# Patient Record
Sex: Male | Born: 1955 | Race: White | Hispanic: No | Marital: Single | Smoking: Never smoker
Health system: Southern US, Community
[De-identification: ages and names within clinical notes are randomized; demographics above are authoritative.]

## PROBLEM LIST (undated history)

## (undated) DIAGNOSIS — Z87898 Personal history of other specified conditions: Secondary | ICD-10-CM

## (undated) DIAGNOSIS — M109 Gout, unspecified: Secondary | ICD-10-CM

## (undated) DIAGNOSIS — E782 Mixed hyperlipidemia: Secondary | ICD-10-CM

## (undated) DIAGNOSIS — R569 Unspecified convulsions: Secondary | ICD-10-CM

---

## 2001-07-10 ENCOUNTER — Emergency Department (HOSPITAL_COMMUNITY): Admission: EM | Admit: 2001-07-10 | Discharge: 2001-07-10 | Payer: Self-pay | Admitting: Emergency Medicine

## 2016-10-06 ENCOUNTER — Encounter (HOSPITAL_COMMUNITY): Payer: Self-pay | Admitting: Emergency Medicine

## 2016-10-06 ENCOUNTER — Emergency Department (HOSPITAL_COMMUNITY)

## 2016-10-06 ENCOUNTER — Emergency Department (HOSPITAL_COMMUNITY)
Admission: EM | Admit: 2016-10-06 | Discharge: 2016-10-07 | Disposition: A | Discharge status: Home / Self Care | Attending: Emergency Medicine | Admitting: Emergency Medicine

## 2016-10-06 DIAGNOSIS — W19XXXA Unspecified fall, initial encounter: Secondary | ICD-10-CM | POA: Diagnosis not present

## 2016-10-06 DIAGNOSIS — M25522 Pain in left elbow: Secondary | ICD-10-CM | POA: Diagnosis not present

## 2016-10-06 DIAGNOSIS — S0990XA Unspecified injury of head, initial encounter: Secondary | ICD-10-CM | POA: Diagnosis present

## 2016-10-06 DIAGNOSIS — Z7982 Long term (current) use of aspirin: Secondary | ICD-10-CM | POA: Insufficient documentation

## 2016-10-06 DIAGNOSIS — Z23 Encounter for immunization: Secondary | ICD-10-CM | POA: Diagnosis not present

## 2016-10-06 DIAGNOSIS — Y990 Civilian activity done for income or pay: Secondary | ICD-10-CM | POA: Insufficient documentation

## 2016-10-06 DIAGNOSIS — S0181XA Laceration without foreign body of other part of head, initial encounter: Secondary | ICD-10-CM | POA: Insufficient documentation

## 2016-10-06 DIAGNOSIS — Z7984 Long term (current) use of oral hypoglycemic drugs: Secondary | ICD-10-CM | POA: Insufficient documentation

## 2016-10-06 DIAGNOSIS — Y9389 Activity, other specified: Secondary | ICD-10-CM | POA: Insufficient documentation

## 2016-10-06 DIAGNOSIS — Y9289 Other specified places as the place of occurrence of the external cause: Secondary | ICD-10-CM | POA: Diagnosis not present

## 2016-10-06 DIAGNOSIS — R569 Unspecified convulsions: Secondary | ICD-10-CM

## 2016-10-06 DIAGNOSIS — Z79899 Other long term (current) drug therapy: Secondary | ICD-10-CM | POA: Insufficient documentation

## 2016-10-06 DIAGNOSIS — M25422 Effusion, left elbow: Secondary | ICD-10-CM | POA: Insufficient documentation

## 2016-10-06 HISTORY — DX: Unspecified convulsions: R56.9

## 2016-10-06 HISTORY — DX: Personal history of other specified conditions: Z87.898

## 2016-10-06 HISTORY — DX: Gout, unspecified: M10.9

## 2016-10-06 HISTORY — DX: Mixed hyperlipidemia: E78.2

## 2016-10-06 LAB — COMPREHENSIVE METABOLIC PANEL
ALBUMIN: 4.3 g/dL (ref 3.5–5.0)
ALK PHOS: 71 U/L (ref 38–126)
ALT: 38 U/L (ref 17–63)
ANION GAP: 9 (ref 5–15)
AST: 45 U/L — ABNORMAL HIGH (ref 15–41)
BUN: 20 mg/dL (ref 6–20)
CALCIUM: 9 mg/dL (ref 8.9–10.3)
CO2: 24 mmol/L (ref 22–32)
Chloride: 109 mmol/L (ref 101–111)
Creatinine, Ser: 0.97 mg/dL (ref 0.61–1.24)
GFR calc Af Amer: >60 mL/min (ref >60)
GFR calc non Af Amer: >60 mL/min (ref >60)
GLUCOSE: 140 mg/dL — AB (ref 65–99)
Potassium: 3.5 mmol/L (ref 3.5–5.1)
Sodium: 142 mmol/L (ref 135–145)
Total Bilirubin: 0.6 mg/dL (ref 0.3–1.2)
Total Protein: 7 g/dL (ref 6.5–8.1)

## 2016-10-06 LAB — CBC WITH DIFFERENTIAL/PLATELET
BASOS PCT: 0 %
Basophils Absolute: 0 10*3/uL (ref 0.0–0.1)
EOS ABS: 0 10*3/uL (ref 0.0–0.7)
Eosinophils Relative: 0 %
HCT: 36.3 % — ABNORMAL LOW (ref 39.0–52.0)
HEMOGLOBIN: 12.1 g/dL — AB (ref 13.0–17.0)
LYMPHS ABS: 1 10*3/uL (ref 0.7–4.0)
LYMPHS PCT: 9 %
MCH: 30.2 pg (ref 26.0–34.0)
MCHC: 33.3 g/dL (ref 30.0–36.0)
MCV: 90.5 fL (ref 78.0–100.0)
MONOS PCT: 7 %
Monocytes Absolute: 0.9 10*3/uL (ref 0.1–1.0)
NEUTROS PCT: 84 %
Neutro Abs: 10.1 10*3/uL — ABNORMAL HIGH (ref 1.7–7.7)
Platelets: 177 10*3/uL (ref 150–400)
RBC: 4.01 MIL/uL — ABNORMAL LOW (ref 4.22–5.81)
RDW: 13.8 % (ref 11.5–15.5)
WBC: 12 10*3/uL — ABNORMAL HIGH (ref 4.0–10.5)

## 2016-10-06 MED ORDER — SODIUM CHLORIDE 0.9 % IV SOLN
1000.0000 mg | Freq: Once | INTRAVENOUS | Status: AC
  Administered 2016-10-06: 1000 mg via INTRAVENOUS
  Filled 2016-10-06: qty 10

## 2016-10-06 MED ORDER — SODIUM CHLORIDE 0.9 % IV BOLUS (SEPSIS)
1000.0000 mL | Freq: Once | INTRAVENOUS | Status: AC
  Administered 2016-10-06: 1000 mL via INTRAVENOUS

## 2016-10-06 MED ORDER — TETANUS-DIPHTH-ACELL PERTUSSIS 5-2.5-18.5 LF-MCG/0.5 IM SUSP
0.5000 mL | Freq: Once | INTRAMUSCULAR | Status: AC
  Administered 2016-10-07: 0.5 mL via INTRAMUSCULAR
  Filled 2016-10-06: qty 0.5

## 2016-10-06 NOTE — ED Triage Notes (Signed)
Pt brought in by EMS from work after having a syncopal episode at work  Pt works at a [employer] and was up on a metal platform when he had a syncopal episode Pt landed face down on the metal grate on which he was standing  Pt has abrasions to his face and bruising to his arm from the grate  Denies neck or back pain  EMS did a 12 lead that showed some peaked T waves so they started an IV and gave a 500 ml bolus of NS

## 2016-10-06 NOTE — ED Notes (Signed)
Bed: WA17 Expected date:  Expected time:  Means of arrival:  Comments: 61 yo M  Syncopal episode

## 2016-10-06 NOTE — ED Notes (Signed)
Patient states he began to get shakey and have vision changed before syncopal episode. Patient has history of seizures, takes Keppra BID and was incontinent on arrival and states that he usually feels that way before his seizures.

## 2016-10-07 MED ORDER — LEVETIRACETAM 1000 MG PO TABS: 1000.0000 mg | ORAL_TABLET | Freq: Two times a day (BID) | ORAL | 0 refills | Status: AC

## 2016-10-07 NOTE — ED Provider Notes (Signed)
[facility]-EMERGENCY DEPT Provider Note   CSN: [id number] Arrival date & time: 10/06/16  2014     History   Chief Complaint Chief Complaint  Patient presents with  . Loss of Consciousness    HPI Scott Harrison is a 61 y.o. male history of, prediabetes here presenting with possible seizure. 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 He did not remember passing out or having a seizure. He did not remember how long he was down for and when he got here, patient noticed that he did urinate on himself. He does have a history of seizures and has been followed with [facility] neurology. Patient is taking Keppra 750 mg twice daily as prescribed.   patient has been seizure-free for the last year and just began to drive. Denies chest pain or shortness of breath prior to it. Also has L elbow pain. Tdap not up to date    The history is provided by the patient.    Past Medical History:  Diagnosis Date  . Elevated triglycerides with high cholesterol   . Gout   . History of prediabetes   . Seizures (HCC)     There are no active problems to display for this patient.   History reviewed. No pertinent surgical history.     Home Medications    Prior to Admission medications   Medication Sig Start Date End Date Taking? Authorizing Provider  allopurinol (ZYLOPRIM) 100 MG tablet Take 100 mg by mouth daily.   Yes Provider, Historical, Scott Harrison  aspirin EC 81 MG tablet Take 81 mg by mouth every morning. 06/04/16 06/04/17 Yes Provider, Historical, Scott Harrison  indomethacin (INDOCIN) 50 MG capsule Take 50 mg by mouth daily as needed for other. 06/04/16  Yes Provider, Historical, Scott Harrison  levETIRAcetam (KEPPRA) 750 MG tablet Take 750 mg by mouth 2 (two) times daily. 11/08/15 11/07/16 Yes Provider, Historical, Scott Harrison  metFORMIN (GLUCOPHAGE-XR) 500 MG 24 hr tablet Take 500 mg by mouth  at bedtime. 06/23/16  Yes Provider, Historical, Scott Harrison  rosuvastatin (CRESTOR) 40 MG tablet Take 40 mg by mouth daily. 06/04/16 06/04/17 Yes Provider, Historical, Scott Harrison    Family History Family History  Problem Relation Age of Onset  . Diabetes Mother     Social History Social History  Substance Use Topics  . Smoking status: Never Smoker  . Smokeless tobacco: Never Used  . Alcohol use No     Allergies   Patient has no known allergies.   Review of Systems Review of Systems  Cardiovascular: Positive for syncope.  Musculoskeletal:       L elbow pain   Skin: Positive for wound.  All other systems reviewed and are negative.    Physical Exam Updated Vital Signs BP (!) 130/91   Pulse 76   Resp 16   SpO2 98%   Physical Exam  Constitutional: He appears well-developed.  HENT:  Head: Normocephalic.  Abrasion forehead. There is 2 cm laceration above L eyebrow that is well approximated. L upper lip swollen with abrasion, no missing teeth   Eyes: Pupils are equal, round, and reactive to light. Conjunctivae and EOM are normal.  Ecchymosis around L eye but extra ocular movement intact   Neck: Normal range of motion. Neck supple.  Cardiovascular: Normal rate, regular rhythm and normal heart sounds.   Pulmonary/Chest: Effort normal and  breath sounds normal. No respiratory distress. He has no wheezes.  Abdominal: Soft. Bowel sounds are normal. He exhibits no distension. There is no tenderness. There is no guarding.  Musculoskeletal:  L elbow slightly swollen and tender, nl ROM   Neurological: He is alert.  Skin: Skin is warm.  Psychiatric: He has a normal mood and affect.  Nursing note and vitals reviewed.    ED Treatments / Results  Labs (all labs ordered are listed, but only abnormal results are displayed) Labs Reviewed  CBC WITH DIFFERENTIAL/PLATELET - Abnormal; Notable for the following:       Result Value   WBC 12.0 (*)    RBC 4.01 (*)    Hemoglobin 12.1 (*)    HCT 36.3  (*)    Neutro Abs 10.1 (*)    All other components within normal limits  COMPREHENSIVE METABOLIC PANEL - Abnormal; Notable for the following:    Glucose, Bld 140 (*)    AST 45 (*)    All other components within normal limits  I-STAT TROPONIN, ED    EKG  EKG Interpretation  Date/Time:  Tuesday October 06 2016 20:37:52 EDT Ventricular Rate:  106 PR Interval:    QRS Duration: 88 QT Interval:  334 QTC Calculation: 444 R Axis:   47 Text Interpretation:  Sinus tachycardia No previous ECGs available Confirmed by Richardean Canal [phone]) on 10/06/2016 10:20:49 PM       Radiology Dg Elbow Complete Left  Result Date: 10/06/2016 CLINICAL DATA:  Syncopal episode with bruising EXAM: LEFT ELBOW - COMPLETE 3+ VIEW COMPARISON:  None. FINDINGS: No definite acute displaced fracture or malalignment is seen. No significant elbow effusion. Minimal spurring at the coronoid process. IMPRESSION: No definite acute osseous abnormality. Radiographic follow-up recommended if persistent clinical concern for fracture Electronically Signed   By: Jasmine Pang M.D.   On: 10/06/2016 22:40   Ct Head Wo Contrast  Result Date: 10/06/2016 CLINICAL DATA:  Syncopal episode while working on airplane at work today. Patient began to be shaking and vision change before the episode. History of seizures. Hematoma and abrasions to the forehead and upper lip. EXAM: CT HEAD WITHOUT CONTRAST CT MAXILLOFACIAL WITHOUT CONTRAST CT CERVICAL SPINE WITHOUT CONTRAST TECHNIQUE: Multidetector CT imaging of the head, cervical spine, and maxillofacial structures were performed using the standard protocol without intravenous contrast. Multiplanar CT image reconstructions of the cervical spine and maxillofacial structures were also generated. COMPARISON:  None. FINDINGS: CT HEAD FINDINGS Brain: No evidence of acute infarction, hemorrhage, hydrocephalus, extra-axial collection or mass lesion/mass effect. Vascular: No hyperdense vessel or unexpected  calcification. Skull: Normal. Negative for fracture or focal lesion. Other: Subcutaneous scalp hematoma over the anterior frontal region. CT MAXILLOFACIAL FINDINGS Osseous: The frontal bones, orbital rims, nasal bones, maxillary antral walls, zygomatic arches, pterygoid plates, mandibles, and temporomandibular joints appear intact. No acute displaced fractures are appreciated. Orbits: Globes and extraocular muscles are intact and symmetrical. Sinuses: Mild mucosal thickening in the paranasal sinuses. No acute air-fluid levels. Mastoid air cells are clear. Soft tissues: Subcutaneous soft tissue hematoma over the anterior frontal region with extension to the left periorbital and left zygomatic region. No retrobulbar extension. CT CERVICAL SPINE FINDINGS Alignment: Normal alignment of the cervical vertebrae and facet joints. C1-2 articulation appears intact. Skull base and vertebrae: No vertebral compression deformities. No focal bone lesion or bone destruction. Bone cortex appears intact. Soft tissues and spinal canal: No prevertebral soft tissue swelling. No paraspinal infiltration. Disc levels: Degenerative changes throughout the cervical spine  with narrowed interspaces and endplate hypertrophic changes. Degenerative changes are most prominent at C3-4, C5-6, and C6-7 levels. Upper chest: No acute abnormalities identified. Other: None. IMPRESSION: 1. No acute intracranial abnormalities. 2. No acute orbital or facial fractures identified. 3. Normal alignment of the cervical spine. Degenerative changes. No acute displaced fractures identified. 4. Subcutaneous soft tissue swelling/hematoma over the anterior frontal region with extension to the left periorbital and left zygomatic arch regions. Electronically Signed   By: Burman Nieves M.D.   On: 10/06/2016 22:33   Ct Cervical Spine Wo Contrast  Result Date: 10/06/2016 CLINICAL DATA:  Syncopal episode while working on airplane at work today. Patient began to be  shaking and vision change before the episode. History of seizures. Hematoma and abrasions to the forehead and upper lip. EXAM: CT HEAD WITHOUT CONTRAST CT MAXILLOFACIAL WITHOUT CONTRAST CT CERVICAL SPINE WITHOUT CONTRAST TECHNIQUE: Multidetector CT imaging of the head, cervical spine, and maxillofacial structures were performed using the standard protocol without intravenous contrast. Multiplanar CT image reconstructions of the cervical spine and maxillofacial structures were also generated. COMPARISON:  None. FINDINGS: CT HEAD FINDINGS Brain: No evidence of acute infarction, hemorrhage, hydrocephalus, extra-axial collection or mass lesion/mass effect. Vascular: No hyperdense vessel or unexpected calcification. Skull: Normal. Negative for fracture or focal lesion. Other: Subcutaneous scalp hematoma over the anterior frontal region. CT MAXILLOFACIAL FINDINGS Osseous: The frontal bones, orbital rims, nasal bones, maxillary antral walls, zygomatic arches, pterygoid plates, mandibles, and temporomandibular joints appear intact. No acute displaced fractures are appreciated. Orbits: Globes and extraocular muscles are intact and symmetrical. Sinuses: Mild mucosal thickening in the paranasal sinuses. No acute air-fluid levels. Mastoid air cells are clear. Soft tissues: Subcutaneous soft tissue hematoma over the anterior frontal region with extension to the left periorbital and left zygomatic region. No retrobulbar extension. CT CERVICAL SPINE FINDINGS Alignment: Normal alignment of the cervical vertebrae and facet joints. C1-2 articulation appears intact. Skull base and vertebrae: No vertebral compression deformities. No focal bone lesion or bone destruction. Bone cortex appears intact. Soft tissues and spinal canal: No prevertebral soft tissue swelling. No paraspinal infiltration. Disc levels: Degenerative changes throughout the cervical spine with narrowed interspaces and endplate hypertrophic changes. Degenerative  changes are most prominent at C3-4, C5-6, and C6-7 levels. Upper chest: No acute abnormalities identified. Other: None. IMPRESSION: 1. No acute intracranial abnormalities. 2. No acute orbital or facial fractures identified. 3. Normal alignment of the cervical spine. Degenerative changes. No acute displaced fractures identified. 4. Subcutaneous soft tissue swelling/hematoma over the anterior frontal region with extension to the left periorbital and left zygomatic arch regions. Electronically Signed   By: Burman Nieves M.D.   On: 10/06/2016 22:33   Ct Maxillofacial Wo Contrast  Result Date: 10/06/2016 CLINICAL DATA:  Syncopal episode while working on airplane at work today. Patient began to be shaking and vision change before the episode. History of seizures. Hematoma and abrasions to the forehead and upper lip. EXAM: CT HEAD WITHOUT CONTRAST CT MAXILLOFACIAL WITHOUT CONTRAST CT CERVICAL SPINE WITHOUT CONTRAST TECHNIQUE: Multidetector CT imaging of the head, cervical spine, and maxillofacial structures were performed using the standard protocol without intravenous contrast. Multiplanar CT image reconstructions of the cervical spine and maxillofacial structures were also generated. COMPARISON:  None. FINDINGS: CT HEAD FINDINGS Brain: No evidence of acute infarction, hemorrhage, hydrocephalus, extra-axial collection or mass lesion/mass effect. Vascular: No hyperdense vessel or unexpected calcification. Skull: Normal. Negative for fracture or focal lesion. Other: Subcutaneous scalp hematoma over the anterior frontal region. CT MAXILLOFACIAL FINDINGS  Osseous: The frontal bones, orbital rims, nasal bones, maxillary antral walls, zygomatic arches, pterygoid plates, mandibles, and temporomandibular joints appear intact. No acute displaced fractures are appreciated. Orbits: Globes and extraocular muscles are intact and symmetrical. Sinuses: Mild mucosal thickening in the paranasal sinuses. No acute air-fluid levels.  Mastoid air cells are clear. Soft tissues: Subcutaneous soft tissue hematoma over the anterior frontal region with extension to the left periorbital and left zygomatic region. No retrobulbar extension. CT CERVICAL SPINE FINDINGS Alignment: Normal alignment of the cervical vertebrae and facet joints. C1-2 articulation appears intact. Skull base and vertebrae: No vertebral compression deformities. No focal bone lesion or bone destruction. Bone cortex appears intact. Soft tissues and spinal canal: No prevertebral soft tissue swelling. No paraspinal infiltration. Disc levels: Degenerative changes throughout the cervical spine with narrowed interspaces and endplate hypertrophic changes. Degenerative changes are most prominent at C3-4, C5-6, and C6-7 levels. Upper chest: No acute abnormalities identified. Other: None. IMPRESSION: 1. No acute intracranial abnormalities. 2. No acute orbital or facial fractures identified. 3. Normal alignment of the cervical spine. Degenerative changes. No acute displaced fractures identified. 4. Subcutaneous soft tissue swelling/hematoma over the anterior frontal region with extension to the left periorbital and left zygomatic arch regions. Electronically Signed   By: Burman Nieves M.D.   On: 10/06/2016 22:33    Procedures Procedures (including critical care time)  LACERATION REPAIR Performed by: Richardean Canal Authorized by: Richardean Canal Consent: Verbal consent obtained. Risks and benefits: risks, benefits and alternatives were discussed Consent given by: patient Patient identity confirmed: provided demographic data Prepped and Draped in normal sterile fashion Wound explored  Laceration Location: L forehead  Laceration Length: 2 cm  No Foreign Bodies seen or palpated  Anesthesia: local infiltration  Local anesthetic: none   Irrigation method: syringe Amount of cleaning: standard  Skin closure: dermabond    Patient tolerance: Patient tolerated the procedure  well with no immediate complications.   Medications Ordered in ED Medications  Tdap (BOOSTRIX) injection 0.5 mL (not administered)  sodium chloride 0.9 % bolus 1,000 mL (1,000 mLs Intravenous New Bag/Given 10/06/16 2303)  levETIRAcetam (KEPPRA) 1,000 mg in sodium chloride 0.9 % 100 mL IVPB (1,000 mg Intravenous New Bag/Given 10/06/16 2303)     Initial Impression / Assessment and Plan / ED Course  I have reviewed the triage vital signs and the nursing notes.  Pertinent labs & imaging results that were available during my care of the patient were reviewed by me and considered in my medical decision making (see chart for details).    OVERTON BOGGUS is a 61 y.o. male here with seizure. Has hx of seizure. Has abrasions on the forehead and scalp hematoma and small laceration. Will get CT head/neck/face, labs. Will consult neuro and load with keppra.   12:12 AM CT head/neck/face showed no fractures. Called Dr. Roseanne Reno from neuro, who recommend IV keppra 1000 mg and increase to 1000 mg BID. Laceration dermabond. Tdap updated. Will dc home. Encouraged him to see neuro and no driving for now    Final Clinical Impressions(s) / ED Diagnoses   Final diagnoses:  None    New Prescriptions New Prescriptions   No medications on file     Scott Pander, Scott Harrison 10/07/16 [phone]

## 2016-10-07 NOTE — Discharge Instructions (Signed)
Increase keppra to 1000 mg twice daily.   Call your neurologist tomorrow for appointment   No driving until cleared by neurologist   Return to ER if you have another seizure, passing out, uncontrolled bleeding

## 2019-04-17 ENCOUNTER — Emergency Department (HOSPITAL_COMMUNITY)
Admission: EM | Admit: 2019-04-17 | Discharge: 2019-04-18 | Disposition: A | Discharge status: Home / Self Care | Attending: Emergency Medicine | Admitting: Emergency Medicine

## 2019-04-17 ENCOUNTER — Other Ambulatory Visit: Payer: Self-pay

## 2019-04-17 DIAGNOSIS — S0181XA Laceration without foreign body of other part of head, initial encounter: Secondary | ICD-10-CM

## 2019-04-17 DIAGNOSIS — Y9289 Other specified places as the place of occurrence of the external cause: Secondary | ICD-10-CM | POA: Insufficient documentation

## 2019-04-17 DIAGNOSIS — W1830XA Fall on same level, unspecified, initial encounter: Secondary | ICD-10-CM | POA: Diagnosis not present

## 2019-04-17 DIAGNOSIS — M62838 Other muscle spasm: Secondary | ICD-10-CM | POA: Insufficient documentation

## 2019-04-17 DIAGNOSIS — Y9301 Activity, walking, marching and hiking: Secondary | ICD-10-CM | POA: Diagnosis not present

## 2019-04-17 DIAGNOSIS — S01111A Laceration without foreign body of right eyelid and periocular area, initial encounter: Secondary | ICD-10-CM | POA: Insufficient documentation

## 2019-04-17 DIAGNOSIS — Y999 Unspecified external cause status: Secondary | ICD-10-CM | POA: Diagnosis not present

## 2019-04-17 DIAGNOSIS — S022XXA Fracture of nasal bones, initial encounter for closed fracture: Secondary | ICD-10-CM | POA: Diagnosis not present

## 2019-04-17 DIAGNOSIS — R569 Unspecified convulsions: Secondary | ICD-10-CM | POA: Diagnosis present

## 2019-04-17 DIAGNOSIS — S01511A Laceration without foreign body of lip, initial encounter: Secondary | ICD-10-CM | POA: Diagnosis not present

## 2019-04-17 NOTE — ED Triage Notes (Signed)
64 yo M BIB GEM[facility] status post unwitnessed seizure. Pt was walking from work area to the break when seizure occurred. Pt has a history of seizures. Pt post ictal on scene GCS of 13 upon EMS arrival. Pt feel face first and has a small skin tear over right eye, laceration over bridge of nose. Pt denies any headache or blurry vision. Pt denies any neck or back pain. No other complaints at the current time per ems.   Vitals: Bp 128/76 Hr 100 cbg 113 98 % room air rr 20

## 2019-04-18 ENCOUNTER — Emergency Department (HOSPITAL_COMMUNITY)

## 2019-04-18 ENCOUNTER — Encounter (HOSPITAL_COMMUNITY): Payer: Self-pay | Admitting: Emergency Medicine

## 2019-04-18 ENCOUNTER — Other Ambulatory Visit: Payer: Self-pay

## 2019-04-18 LAB — CBC WITH DIFFERENTIAL/PLATELET
Abs Immature Granulocytes: 0.05 10*3/uL (ref 0.00–0.07)
Basophils Absolute: 0 10*3/uL (ref 0.0–0.1)
Basophils Relative: 0 %
Eosinophils Absolute: 0.1 10*3/uL (ref 0.0–0.5)
Eosinophils Relative: 1 %
HCT: 40 % (ref 39.0–52.0)
Hemoglobin: 13.1 g/dL (ref 13.0–17.0)
Immature Granulocytes: 1 %
Lymphocytes Relative: 17 %
Lymphs Abs: 1.6 10*3/uL (ref 0.7–4.0)
MCH: 31.5 pg (ref 26.0–34.0)
MCHC: 32.8 g/dL (ref 30.0–36.0)
MCV: 96.2 fL (ref 80.0–100.0)
Monocytes Absolute: 0.7 10*3/uL (ref 0.1–1.0)
Monocytes Relative: 8 %
Neutro Abs: 6.7 10*3/uL (ref 1.7–7.7)
Neutrophils Relative %: 73 %
Platelets: 184 10*3/uL (ref 150–400)
RBC: 4.16 MIL/uL — ABNORMAL LOW (ref 4.22–5.81)
RDW: 12.6 % (ref 11.5–15.5)
WBC: 9.1 10*3/uL (ref 4.0–10.5)
nRBC: 0 % (ref 0.0–0.2)

## 2019-04-18 LAB — BASIC METABOLIC PANEL
Anion gap: 8 (ref 5–15)
BUN: 16 mg/dL (ref 8–23)
CO2: 28 mmol/L (ref 22–32)
Calcium: 9 mg/dL (ref 8.9–10.3)
Chloride: 105 mmol/L (ref 98–111)
Creatinine, Ser: 0.86 mg/dL (ref 0.61–1.24)
GFR calc Af Amer: >60 mL/min (ref >60)
GFR calc non Af Amer: >60 mL/min (ref >60)
Glucose, Bld: 126 mg/dL — ABNORMAL HIGH (ref 70–99)
Potassium: 3.6 mmol/L (ref 3.5–5.1)
Sodium: 141 mmol/L (ref 135–145)

## 2019-04-18 MED ORDER — LIDOCAINE HCL (PF) 1 % IJ SOLN
30.0000 mL | Freq: Once | INTRAMUSCULAR | Status: DC
  Filled 2019-04-18: qty 30

## 2019-04-18 NOTE — ED Notes (Signed)
Pt cleaned up and bandaged post placement of sutures

## 2019-04-18 NOTE — ED Notes (Signed)
Pt discharged with his neighbor

## 2019-04-18 NOTE — ED Provider Notes (Signed)
[facility]-EMERGENCY DEPT Provider Note   CSN: [id number] Arrival date & time: 04/17/19  2346     History Chief Complaint  Patient presents with  . Seizures    Scott Harrison is a 64 y.o. male.  The history is provided by the patient and medical records.  Seizures   64 year old male with history of gout, prediabetes, seizure disorder, presenting to the ED after a seizure.  Patient was at work and was walking to the break room when he had an unwitnessed seizure.  He was found by coworkers flat on his face who then called EMS.  Patient states he woke up in the ambulance.  He did not realize he had had a seizure.  States this is only his fifth lifetime seizure.  He is on Keppra and has been compliant with his medications.  He has follow-up with his neurologist next month.  Patient does have some pain in the face, mostly over the right eye, nose, and mouth.  Tetanus UTD.  Past Medical History:  Diagnosis Date  . Elevated triglycerides with high cholesterol   . Gout   . History of prediabetes   . Seizures (HCC)     There are no problems to display for this patient.   History reviewed. No pertinent surgical history.     Family History  Problem Relation Age of Onset  . Diabetes Mother     Social History   Tobacco Use  . Smoking status: Never Smoker  . Smokeless tobacco: Never Used  Substance Use Topics  . Alcohol use: No  . Drug use: No    Home Medications Prior to Admission medications   Medication Sig Start Date End Date Taking? Authorizing Provider  allopurinol (ZYLOPRIM) 100 MG tablet Take 100 mg by mouth daily.    [provider]  indomethacin (INDOCIN) 50 MG capsule Take 50 mg by mouth daily as needed for other. 06/04/16   [provider]  levETIRAcetam (KEPPRA) 1000 MG tablet Take 1 tablet (1,000 mg total) by mouth 2 (two) times daily. 10/07/16   Charlynne Pander, MD  metFORMIN (GLUCOPHAGE-XR) 500 MG 24 hr tablet Take  500 mg by mouth at bedtime. 06/23/16   [provider]  rosuvastatin (CRESTOR) 40 MG tablet Take 40 mg by mouth daily. 06/04/16 06/04/17  [provider]    Allergies    Patient has no known allergies.  Review of Systems   Review of Systems  Neurological: Positive for seizures.  All other systems reviewed and are negative.   Physical Exam Updated Vital Signs BP 133/85 (BP Location: Right Arm)   Pulse 96   Temp 98.5 F (36.9 C) (Oral)   Resp 15   Ht 5\' 11"  (1.803 m)   Wt 99.3 kg   SpO2 96%   BMI 30.54 kg/m   Physical Exam Vitals and nursing note reviewed.  Constitutional:      Appearance: He is well-developed.  HENT:     Head: Normocephalic and atraumatic.     Comments: Large hematoma right forehead with swelling and bruising extending down to around right eye    Right Ear: Tympanic membrane and ear canal normal.     Left Ear: Tympanic membrane and ear canal normal.     Nose: Signs of injury and nasal tenderness present.     Right Nostril: Epistaxis present.     Left Nostril: Epistaxis present.     Comments: Bridge of nose with large amount of swelling, skin  tear noted, no open wound/laceration present; blood present in both nares    Mouth/Throat:     Mouth: Injury present.      Comments: 1cm laceration along right lower lip, appears to just barely cross into the vermilion border; upper central incisors both appear to have fracture lines horizontally along upper portion of teeth, some mild oozing of blood from the surrounding gingiva present Eyes:     Conjunctiva/sclera: Conjunctivae normal.     Pupils: Pupils are equal, round, and reactive to light.      Comments: 3cm laceration of right eyebrow/upper eyelid, no active bleeding, globe appears intact  Cardiovascular:     Rate and Rhythm: Normal rate and regular rhythm.     Heart sounds: Normal heart sounds.  Pulmonary:     Effort: Pulmonary effort is normal. No respiratory distress.     Breath sounds:  Normal breath sounds. No rhonchi.  Abdominal:     General: Bowel sounds are normal.     Palpations: Abdomen is soft.     Tenderness: There is no abdominal tenderness. There is no rebound.  Musculoskeletal:        General: Normal range of motion.     Cervical back: Normal range of motion.  Skin:    General: Skin is warm and dry.  Neurological:     Mental Status: He is alert and oriented to person, place, and time.     ED Results / Procedures / Treatments   Labs (all labs ordered are listed, but only abnormal results are displayed) Labs Reviewed  CBC WITH DIFFERENTIAL/PLATELET - Abnormal; Notable for the following components:      Result Value   RBC 4.16 (*)    All other components within normal limits  BASIC METABOLIC PANEL - Abnormal; Notable for the following components:   Glucose, Bld 126 (*)    All other components within normal limits    EKG None  Radiology CT Head Wo Contrast  Result Date: 04/18/2019 CLINICAL DATA:  Fall with facial trauma EXAM: CT HEAD WITHOUT CONTRAST CT MAXILLOFACIAL WITHOUT CONTRAST CT CERVICAL SPINE WITHOUT CONTRAST TECHNIQUE: Multidetector CT imaging of the head, cervical spine, and maxillofacial structures were performed using the standard protocol without intravenous contrast. Multiplanar CT image reconstructions of the cervical spine and maxillofacial structures were also generated. COMPARISON:  None. FINDINGS: CT HEAD FINDINGS Brain: There is no mass, hemorrhage or extra-axial collection. The size and configuration of the ventricles and extra-axial CSF spaces are normal. The brain parenchyma is normal, without evidence of acute or chronic infarction. Vascular: No abnormal hyperdensity of the major intracranial arteries or dural venous sinuses. No intracranial atherosclerosis. Skull: Large right frontal scalp hematoma. No skull fracture. CT MAXILLOFACIAL FINDINGS Osseous: --Complex facial fracture types: No LeFort, zygomaticomaxillary complex or  nasoorbitoethmoidal fracture. --Simple fracture types: Medial displacement of right nasal bone fracture --Mandible: No fracture or dislocation. Orbits: The globes are intact. Normal appearance of the intra- and extraconal fat. Symmetric extraocular muscles and optic nerves. Sinuses: No fluid levels or advanced mucosal thickening. Soft tissues: Paranasal and right facial soft tissue swelling. CT CERVICAL SPINE FINDINGS Alignment: No static subluxation. Facets are aligned. Occipital condyles and the lateral masses of C1-C2 are aligned. Skull base and vertebrae: No acute fracture. Soft tissues and spinal canal: No prevertebral fluid or swelling. No visible canal hematoma. Disc levels: Multilevel degenerative disc disease. No bony spinal canal stenosis. Bilateral moderate stenosis of the C7 neural foramina. Upper chest: No pneumothorax, pulmonary nodule or pleural effusion.  Other: Normal visualized paraspinal cervical soft tissues. IMPRESSION: 1. No acute intracranial abnormality. 2. Large right frontal scalp hematoma without skull fracture. 3. Medially displaced right nasal bone fracture. No other facial fracture. 4. No acute fracture or static subluxation of the cervical spine. Electronically Signed   By: Deatra Robinson M.D.   On: 04/18/2019 01:49   CT Cervical Spine Wo Contrast  Result Date: 04/18/2019 CLINICAL DATA:  Fall with facial trauma EXAM: CT HEAD WITHOUT CONTRAST CT MAXILLOFACIAL WITHOUT CONTRAST CT CERVICAL SPINE WITHOUT CONTRAST TECHNIQUE: Multidetector CT imaging of the head, cervical spine, and maxillofacial structures were performed using the standard protocol without intravenous contrast. Multiplanar CT image reconstructions of the cervical spine and maxillofacial structures were also generated. COMPARISON:  None. FINDINGS: CT HEAD FINDINGS Brain: There is no mass, hemorrhage or extra-axial collection. The size and configuration of the ventricles and extra-axial CSF spaces are normal. The brain  parenchyma is normal, without evidence of acute or chronic infarction. Vascular: No abnormal hyperdensity of the major intracranial arteries or dural venous sinuses. No intracranial atherosclerosis. Skull: Large right frontal scalp hematoma. No skull fracture. CT MAXILLOFACIAL FINDINGS Osseous: --Complex facial fracture types: No LeFort, zygomaticomaxillary complex or nasoorbitoethmoidal fracture. --Simple fracture types: Medial displacement of right nasal bone fracture --Mandible: No fracture or dislocation. Orbits: The globes are intact. Normal appearance of the intra- and extraconal fat. Symmetric extraocular muscles and optic nerves. Sinuses: No fluid levels or advanced mucosal thickening. Soft tissues: Paranasal and right facial soft tissue swelling. CT CERVICAL SPINE FINDINGS Alignment: No static subluxation. Facets are aligned. Occipital condyles and the lateral masses of C1-C2 are aligned. Skull base and vertebrae: No acute fracture. Soft tissues and spinal canal: No prevertebral fluid or swelling. No visible canal hematoma. Disc levels: Multilevel degenerative disc disease. No bony spinal canal stenosis. Bilateral moderate stenosis of the C7 neural foramina. Upper chest: No pneumothorax, pulmonary nodule or pleural effusion. Other: Normal visualized paraspinal cervical soft tissues. IMPRESSION: 1. No acute intracranial abnormality. 2. Large right frontal scalp hematoma without skull fracture. 3. Medially displaced right nasal bone fracture. No other facial fracture. 4. No acute fracture or static subluxation of the cervical spine. Electronically Signed   By: Deatra Robinson M.D.   On: 04/18/2019 01:49   CT Maxillofacial Wo Contrast  Result Date: 04/18/2019 CLINICAL DATA:  Fall with facial trauma EXAM: CT HEAD WITHOUT CONTRAST CT MAXILLOFACIAL WITHOUT CONTRAST CT CERVICAL SPINE WITHOUT CONTRAST TECHNIQUE: Multidetector CT imaging of the head, cervical spine, and maxillofacial structures were performed  using the standard protocol without intravenous contrast. Multiplanar CT image reconstructions of the cervical spine and maxillofacial structures were also generated. COMPARISON:  None. FINDINGS: CT HEAD FINDINGS Brain: There is no mass, hemorrhage or extra-axial collection. The size and configuration of the ventricles and extra-axial CSF spaces are normal. The brain parenchyma is normal, without evidence of acute or chronic infarction. Vascular: No abnormal hyperdensity of the major intracranial arteries or dural venous sinuses. No intracranial atherosclerosis. Skull: Large right frontal scalp hematoma. No skull fracture. CT MAXILLOFACIAL FINDINGS Osseous: --Complex facial fracture types: No LeFort, zygomaticomaxillary complex or nasoorbitoethmoidal fracture. --Simple fracture types: Medial displacement of right nasal bone fracture --Mandible: No fracture or dislocation. Orbits: The globes are intact. Normal appearance of the intra- and extraconal fat. Symmetric extraocular muscles and optic nerves. Sinuses: No fluid levels or advanced mucosal thickening. Soft tissues: Paranasal and right facial soft tissue swelling. CT CERVICAL SPINE FINDINGS Alignment: No static subluxation. Facets are aligned. Occipital condyles and the  lateral masses of C1-C2 are aligned. Skull base and vertebrae: No acute fracture. Soft tissues and spinal canal: No prevertebral fluid or swelling. No visible canal hematoma. Disc levels: Multilevel degenerative disc disease. No bony spinal canal stenosis. Bilateral moderate stenosis of the C7 neural foramina. Upper chest: No pneumothorax, pulmonary nodule or pleural effusion. Other: Normal visualized paraspinal cervical soft tissues. IMPRESSION: 1. No acute intracranial abnormality. 2. Large right frontal scalp hematoma without skull fracture. 3. Medially displaced right nasal bone fracture. No other facial fracture. 4. No acute fracture or static subluxation of the cervical spine.  Electronically Signed   By: Deatra RobinsonKevin  Herman M.D.   On: 04/18/2019 01:49    Procedures Procedures (including critical care time)  LACERATION REPAIR Performed by: Garlon HatchetLisa M Tailer Volkert Authorized by: Garlon HatchetLisa M Adalyna Godbee Consent: Verbal consent obtained. Risks and benefits: risks, benefits and alternatives were discussed Consent given by: patient Patient identity confirmed: provided demographic data Prepped and Draped in normal sterile fashion Wound explored  Laceration Location: right eyebrow  Laceration Length: 3 cm  No Foreign Bodies seen or palpated  Anesthesia: local infiltration  Local anesthetic: lidocaine 1% without epinephrine  Anesthetic total: 4 ml  Irrigation method: syringe Amount of cleaning: standard  Skin closure: 4-0 prolene  Number of sutures: 5  Technique: simple interrupted  Patient tolerance: Patient tolerated the procedure well with no immediate complications.  LACERATION REPAIR Performed by: Garlon HatchetLisa M Destyni Hoppel Authorized by: Garlon HatchetLisa M Yassir Enis Consent: Verbal consent obtained. Risks and benefits: risks, benefits and alternatives were discussed Consent given by: patient Patient identity confirmed: provided demographic data Prepped and Draped in normal sterile fashion Wound explored  Laceration Location: right lower lip  Laceration Length: 2cm  No Foreign Bodies seen or palpated  Anesthesia: local infiltration  Local anesthetic: lidocaine 1% without epinephrine  Anesthetic total: 3 ml  Irrigation method: syringe Amount of cleaning: standard  Skin closure: 5-0 vicryl rapide  Number of sutures: 4  Technique: simple interrupted  Patient tolerance: Patient tolerated the procedure well with no immediate complications.   Medications Ordered in ED Medications  lidocaine (PF) (XYLOCAINE) 1 % injection 30 mL (has no administration in time range)    ED Course  I have reviewed the triage vital signs and the nursing notes.  Pertinent labs & imaging  results that were available during my care of the patient were reviewed by me and considered in my medical decision making (see chart for details).    MDM Rules/Calculators/A&P  64 year old male here following seizure.  History of same, none in the past few years.  He is on Keppra and has been compliant.  On arrival to ED he is awake, alert, appropriately oriented.  He is moving his arms and legs well without any noted ataxia.  Speech is clear and goal oriented.  He does have signs of trauma to the head and face with large hematoma to right forehead, laceration of right eyebrow, skin tear and swelling along the bridge of nose, laceration of right lower lip.  His central upper incisors also appear to have fracture lines horizontally along the upper portion of the tooth.  He does report these feel a little loose.  There is no noted trismus or malocclusion.  Will obtain screening labs along with CT of the head/face/neck.  Tetanus is UTD.  Imaging negative side from noted hematoma and right nasal fracture.  c-collar removed, patient was able to range neck without difficulty.  We have discussed imaging results.  Lacerations were repaired as above,  patient tolerated well.  He will need follow-up with ENT regarding nasal fracture and follow-up with dentistry regarding upper central incisors.  Given lip laceration, I have encouraged soft diet for the next few days until these fully heal.  He will need suture removal in 1 week with PCP.  He is placed on seizure driving precautions until follow-up with neurology and cleared from their standpoint.  He will return here for any new/acute changes.  3:06 AM Just prior to discharge patient began complaining of left thigh pain.  On exam he does not have any significant swelling, but does appear to have some spasm of the musculature of the left thigh.  There is no bony deformity and he is moving his leg appropriately.  I have low suspicion for femur fracture.  Patient  states he would feel better with an x-ray so this is been ordered.  3:36 AM Femur films negative.  Discharge with plan as above.  Final Clinical Impression(s) / ED Diagnoses Final diagnoses:  Seizure [facility])  Facial laceration, initial encounter    Rx / DC Orders ED Discharge Orders    None       Garlon Hatchet, PA-C 04/18/19 0404    Devoria Albe, MD 04/18/19 [phone]

## 2019-04-18 NOTE — Discharge Instructions (Addendum)
Recommend tylenol or motrin as needed for pain.  Would continue to ice the area above right eye to help bring swelling down. Follow-up with ENT about your nasal fracture-- call for appt. I would follow-up with dentist about loose teeth.  I have listed out dentist on call if you cannot get in with yours. Follow-up with your primary care doctor in 1 week to have right eyebrow sutures removed.  Sutures in the lip will dissolve on their own. Recommend soft diet for the next few days to prevent further injury to the lip. Return to the ED for new or worsening symptoms.

## 2019-04-18 NOTE — ED Notes (Signed)
Pt complains of left thigh pain, no obvious injury to the leg but the muscle appears to be tight, pt is able to move his leg

## 2019-04-18 NOTE — ED Notes (Signed)
Pt transported to CT ?

## 2021-07-29 IMAGING — CT CT HEAD W/O CM
3 series · 14 of 47 positions shown, 16 images · non-contrast
Comparison: None.

CLINICAL DATA: Fall with facial trauma

EXAM:
CT HEAD WITHOUT CONTRAST
CT MAXILLOFACIAL WITHOUT CONTRAST
CT CERVICAL SPINE WITHOUT CONTRAST
TECHNIQUE: Multidetector CT imaging of the head, cervical spine, and
maxillofacial structures were performed using the standard protocol
without intravenous contrast. Multiplanar CT image reconstructions
of the cervical spine and maxillofacial structures were also
generated.

[Series 3: head wo · axial · 0.47mm/px · z∈[+1518,+1653]mm · 8 of 33 slices shown, 10 images]
[im 3/33  brain]
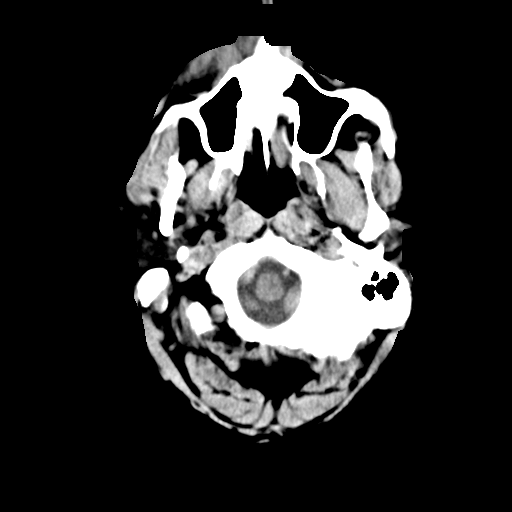
[im 3/33  bone]
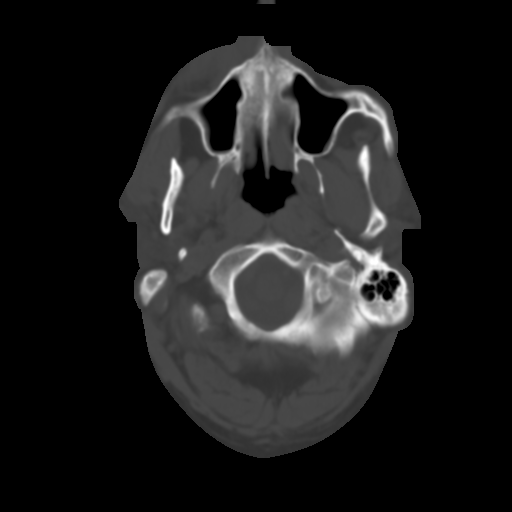
[im 7/33  brain]
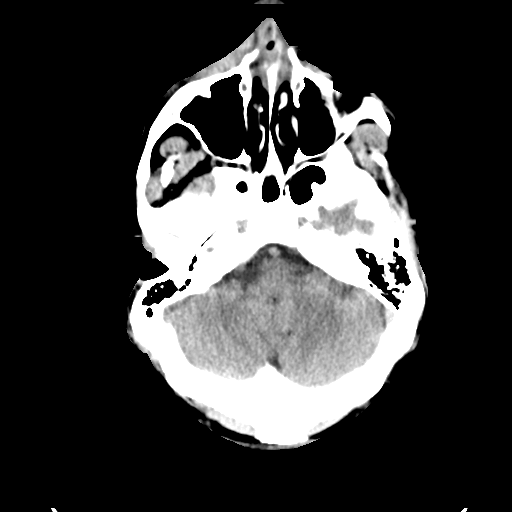
[im 10/33  brain]
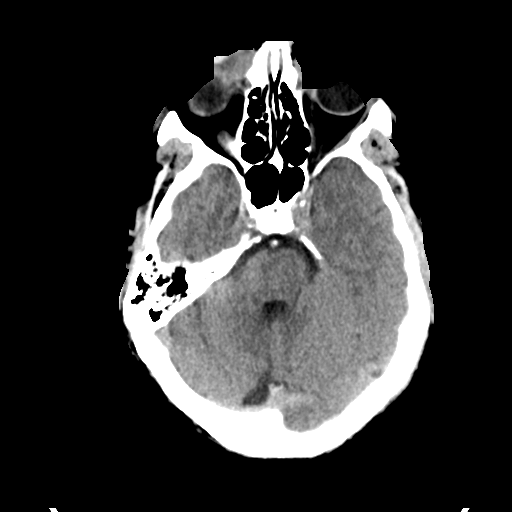
[im 15/33  brain]
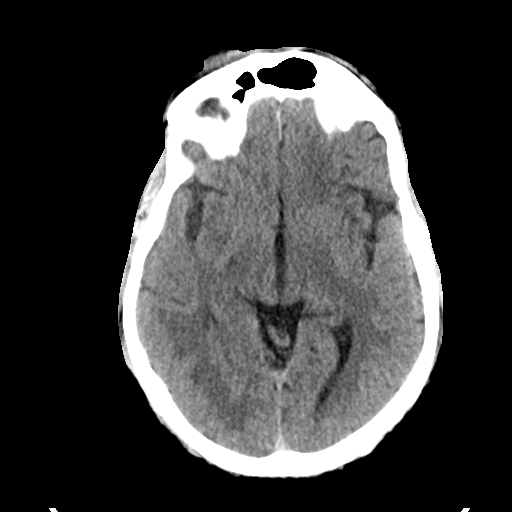
[im 18/33  brain]
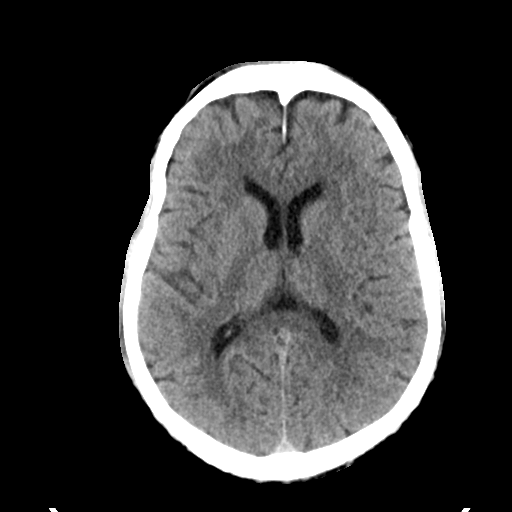
[im 18/33  bone]
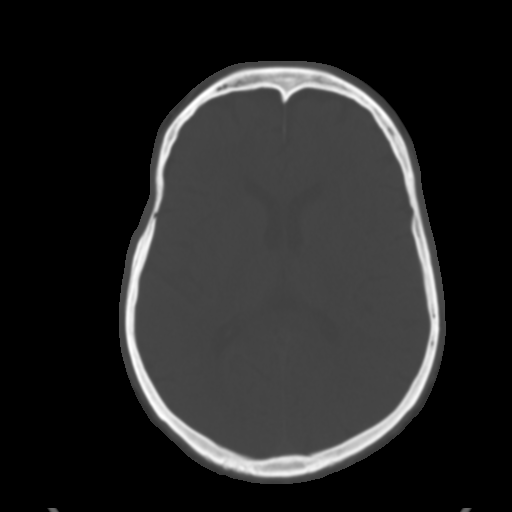
[im 23/33  brain]
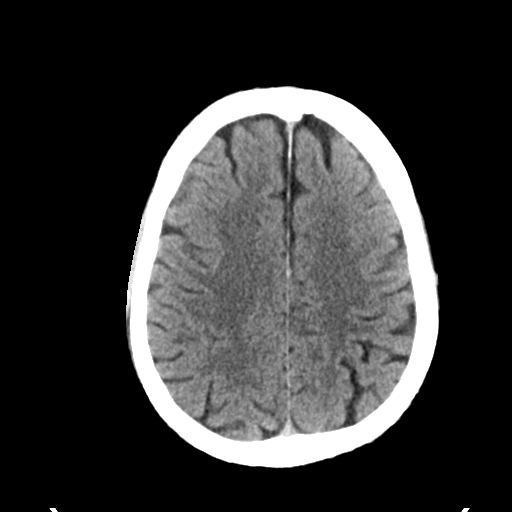
[im 26/33  brain]
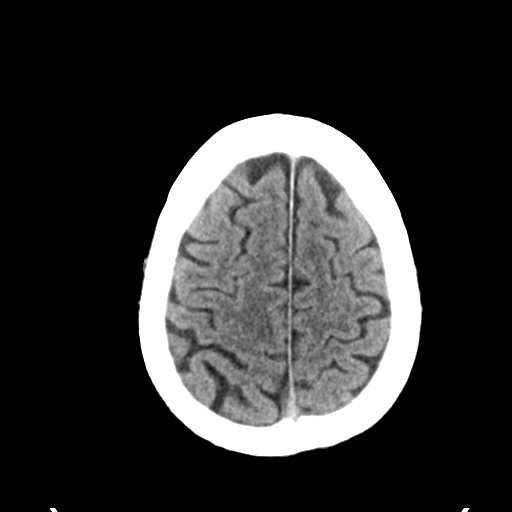
[im 30/33  brain]
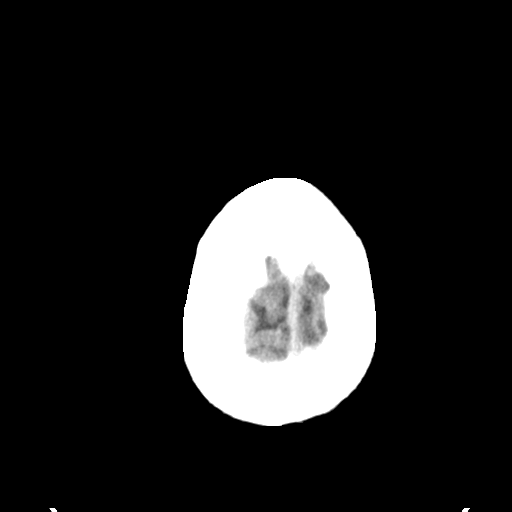

[Series 5: coronal soft tissue · coronal · 0.31mm/px · 3 of 70 slices shown]
[im 24/70  brain]
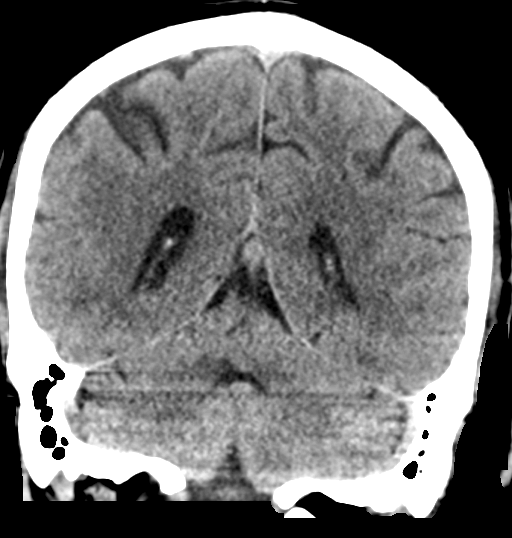
[im 31/70  brain]
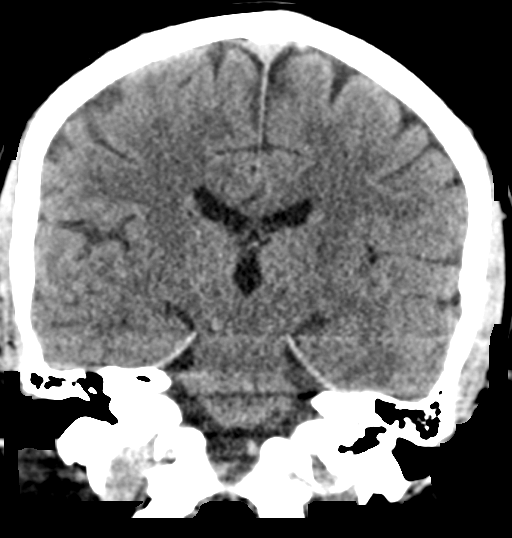
[im 39/70  brain]
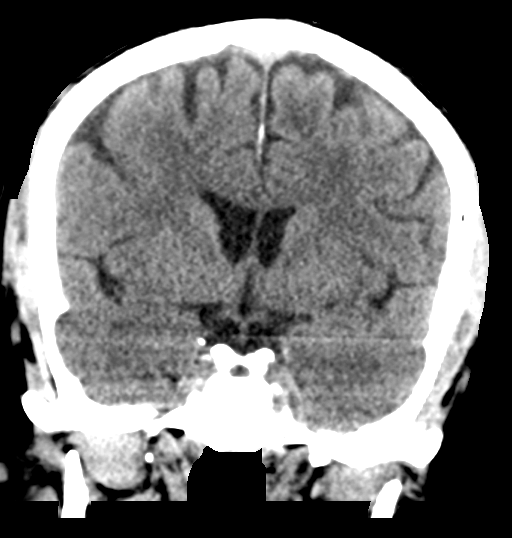

[Series 6: sagittal soft tissue · sagittal · 0.32mm/px · 3 of 55 slices shown]
[im 19/55  brain]
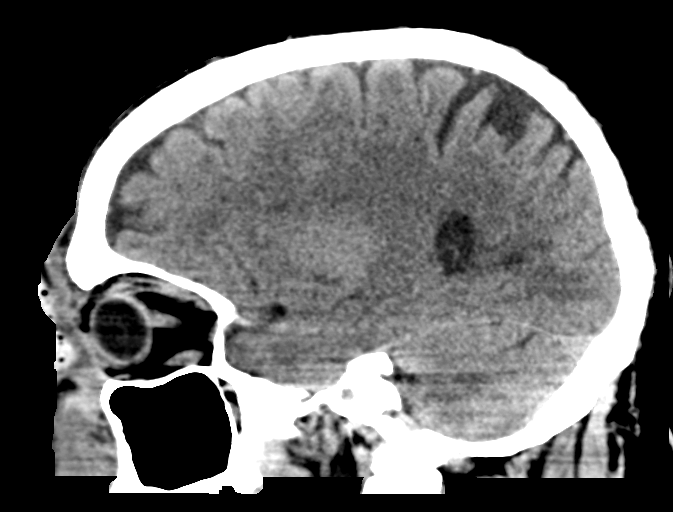
[im 28/55  brain]
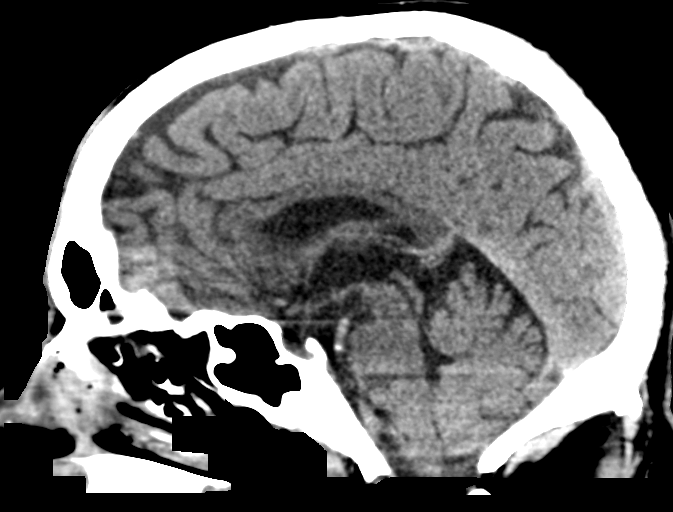
[im 37/55  brain]
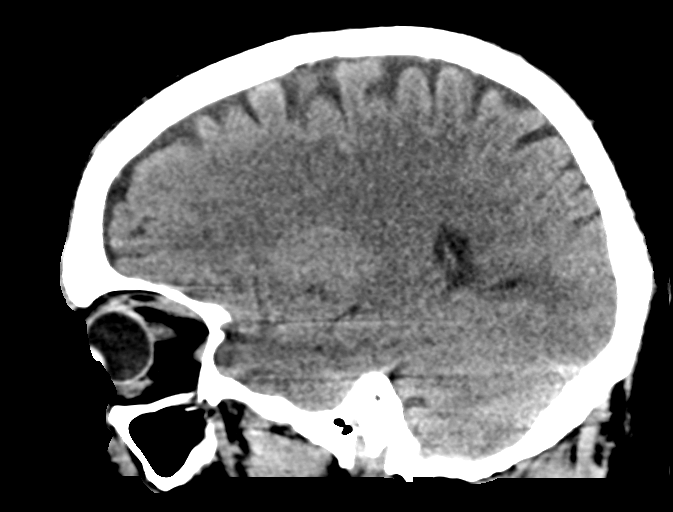

[14 of 47 positions shown; findings below may reference images not displayed]

FINDINGS: CT HEAD FINDINGS

Brain: There is no mass, hemorrhage or extra-axial collection. The
size and configuration of the ventricles and extra-axial CSF spaces
are normal. The brain parenchyma is normal, without evidence of
acute or chronic infarction.

Vascular: No abnormal hyperdensity of the major intracranial
arteries or dural venous sinuses. No intracranial atherosclerosis.

Skull: Large right frontal scalp hematoma. No skull fracture.

CT MAXILLOFACIAL FINDINGS

Osseous:

--Complex facial fracture types: No LeFort, zygomaticomaxillary
complex or nasoorbitoethmoidal fracture.

--Simple fracture types: Medial displacement of right nasal bone
fracture

--Mandible: No fracture or dislocation.

Orbits: The globes are intact. Normal appearance of the intra- and
extraconal fat. Symmetric extraocular muscles and optic nerves.

Sinuses: No fluid levels or advanced mucosal thickening.

Soft tissues: Paranasal and right facial soft tissue swelling.

CT CERVICAL SPINE FINDINGS

Alignment: No static subluxation. Facets are aligned. Occipital
condyles and the lateral masses of C1-C2 are aligned.

Skull base and vertebrae: No acute fracture.

Soft tissues and spinal canal: No prevertebral fluid or swelling. No
visible canal hematoma.

Disc levels: Multilevel degenerative disc disease. No bony spinal
canal stenosis. Bilateral moderate stenosis of the C7 neural
foramina.

Upper chest: No pneumothorax, pulmonary nodule or pleural effusion.

Other: Normal visualized paraspinal cervical soft tissues.
IMPRESSION: 1. No acute intracranial abnormality.
2. Large right frontal scalp hematoma without skull fracture.
3. Medially displaced right nasal bone fracture. No other facial
fracture.
4. No acute fracture or static subluxation of the cervical spine.

## 2021-07-29 IMAGING — CT CT CERVICAL SPINE W/O CM
3 of 4 series · 11 of 33 positions shown, 13 images · non-contrast
Comparison: None.

CLINICAL DATA: Fall with facial trauma

EXAM:
CT HEAD WITHOUT CONTRAST
CT MAXILLOFACIAL WITHOUT CONTRAST
CT CERVICAL SPINE WITHOUT CONTRAST
TECHNIQUE: Multidetector CT imaging of the head, cervical spine, and
maxillofacial structures were performed using the standard protocol
without intravenous contrast. Multiplanar CT image reconstructions
of the cervical spine and maxillofacial structures were also
generated.

[Series 5: sagittal bone · sagittal · 0.25mm/px · 5 of 61 slices shown, 6 images]
[im 21/61  bone]
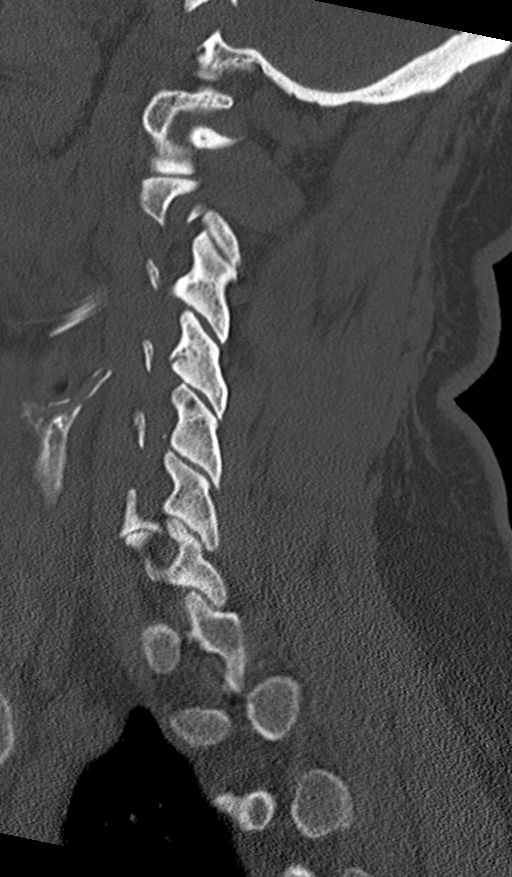
[im 26/61  bone]
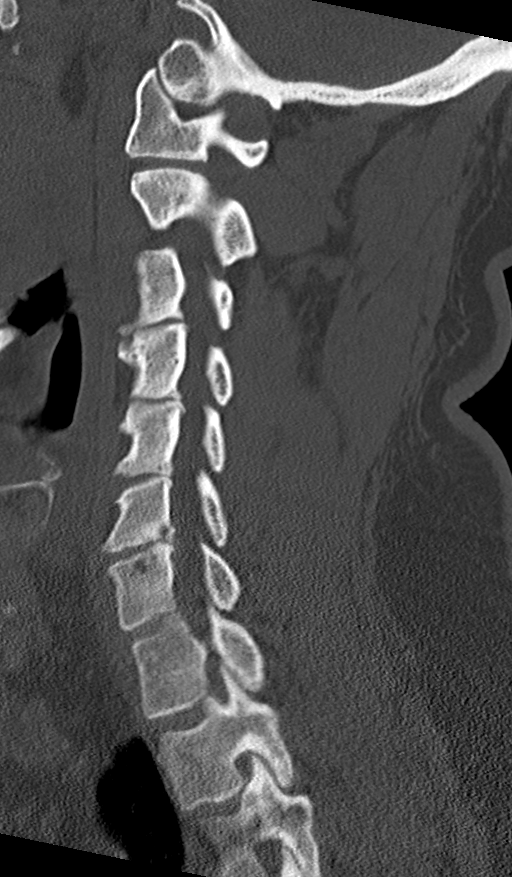
[im 31/61  soft-tissue]
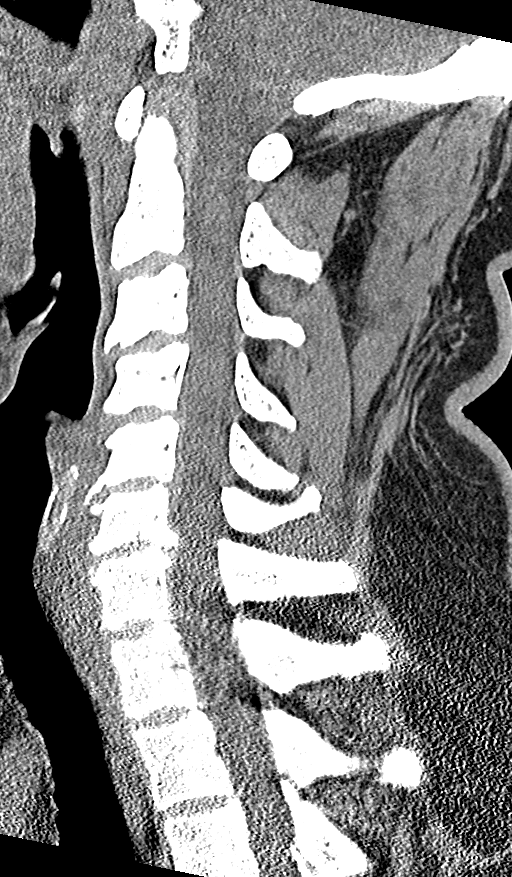
[im 31/61  bone]
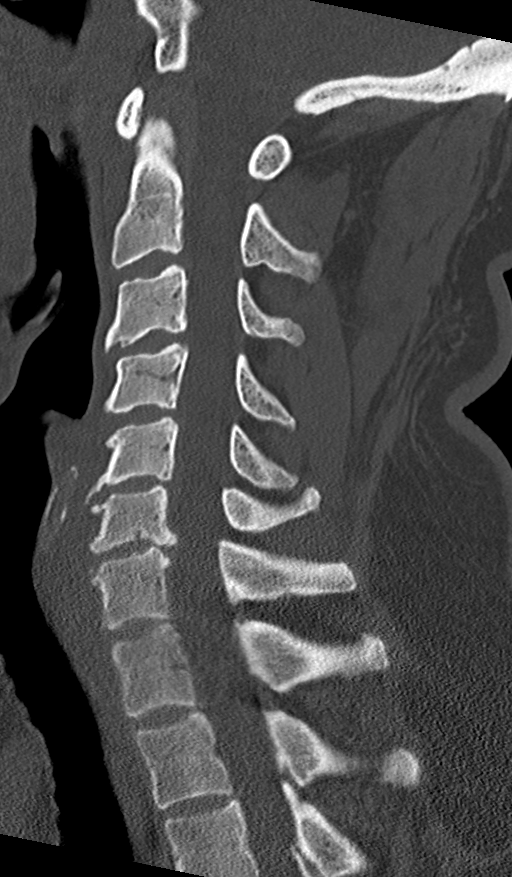
[im 36/61  bone]
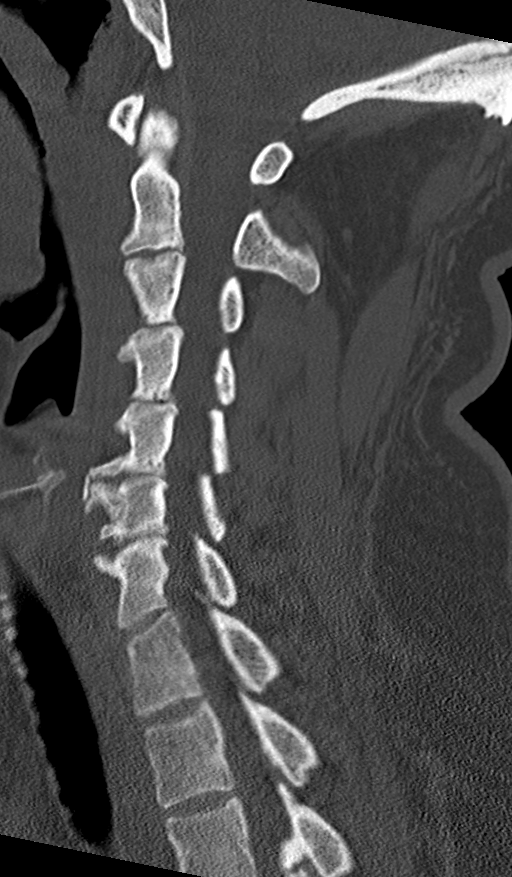
[im 41/61  bone]
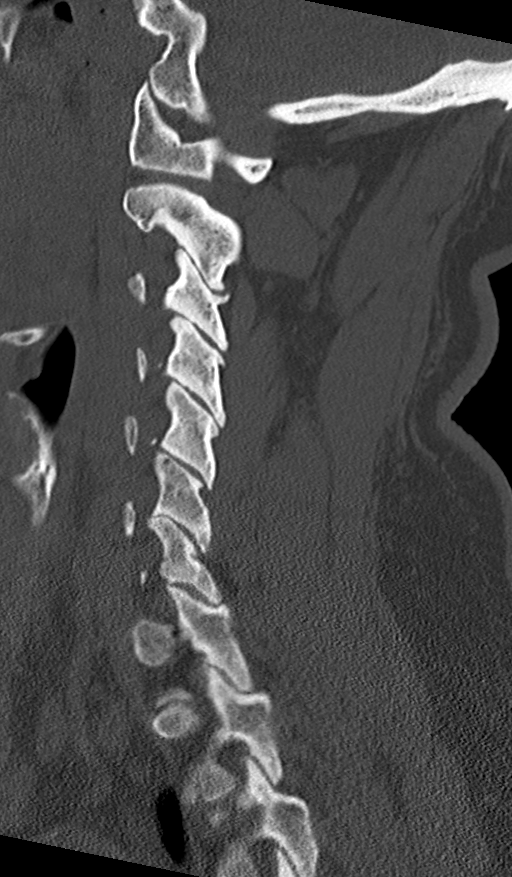

[Series 6: coronal bone · coronal · 0.32mm/px · 3 of 57 slices shown]
[im 12/57  bone]
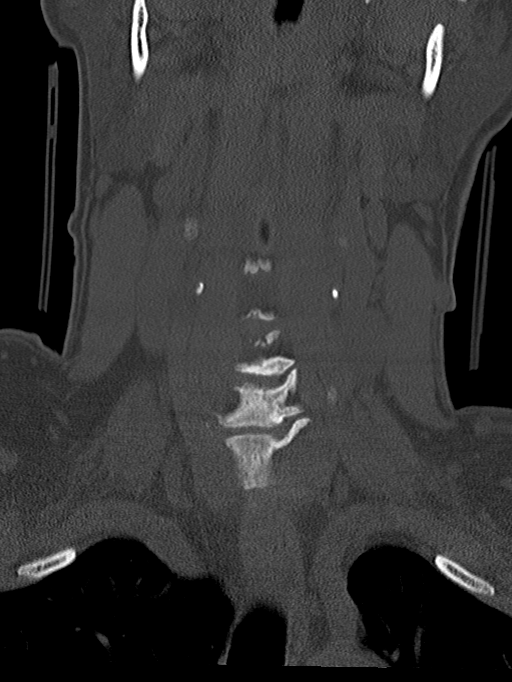
[im 23/57  bone]
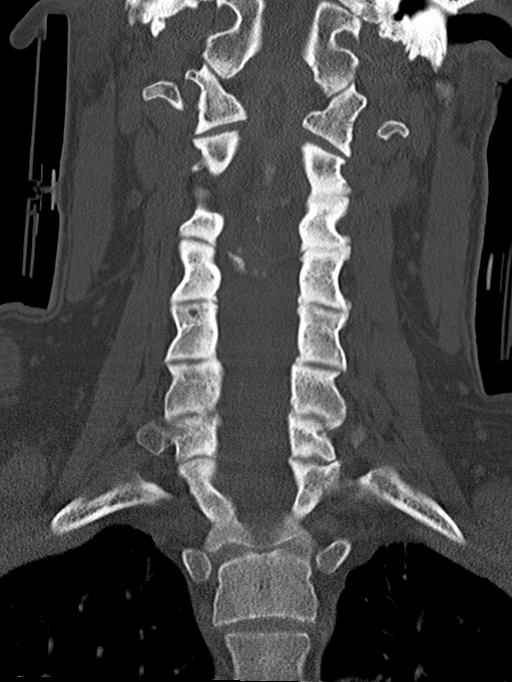
[im 34/57  bone]
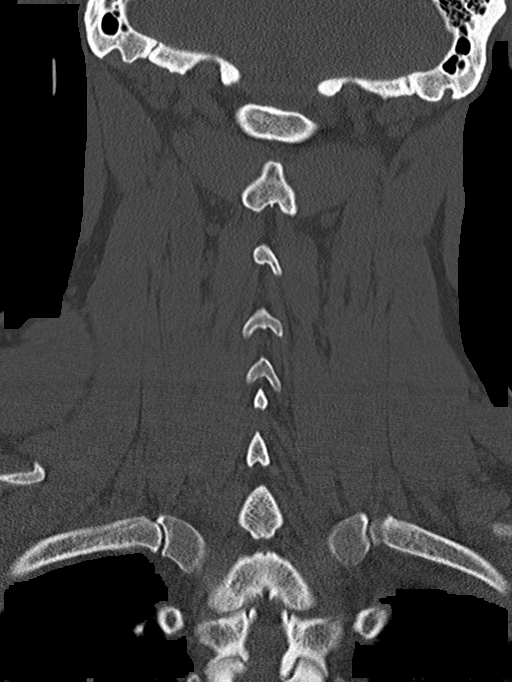

[Series 7: orthogonal axials · axial · 0.23mm/px · z∈[+1320,+1460]mm · 3 of 110 slices shown, 4 images]
[im 19/110  soft-tissue]
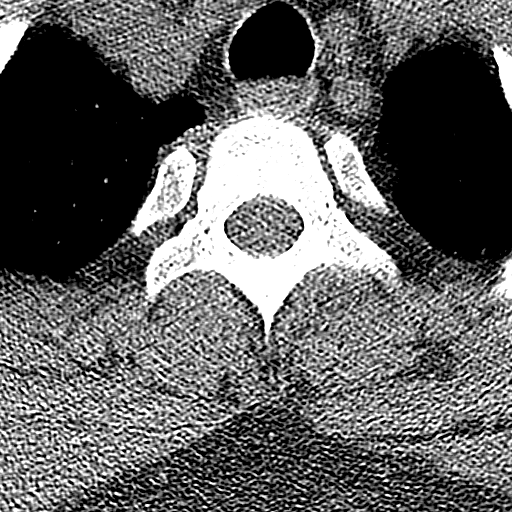
[im 19/110  bone]
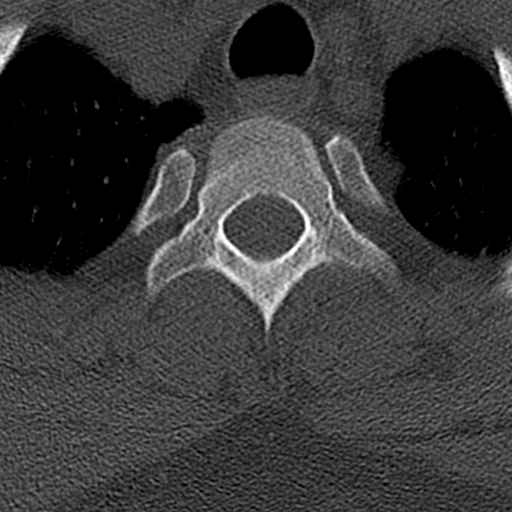
[im 55/110  bone]
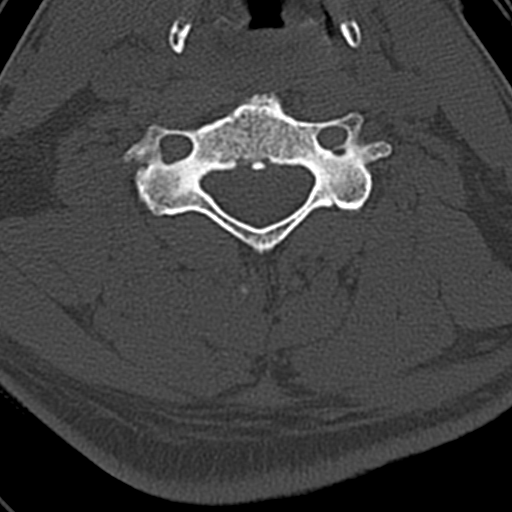
[im 91/110  bone]
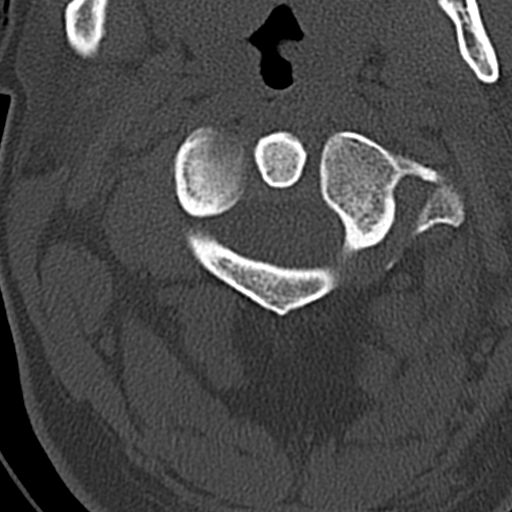

[11 of 33 positions shown; findings below may reference images not displayed]

FINDINGS: CT HEAD FINDINGS

Brain: There is no mass, hemorrhage or extra-axial collection. The
size and configuration of the ventricles and extra-axial CSF spaces
are normal. The brain parenchyma is normal, without evidence of
acute or chronic infarction.

Vascular: No abnormal hyperdensity of the major intracranial
arteries or dural venous sinuses. No intracranial atherosclerosis.

Skull: Large right frontal scalp hematoma. No skull fracture.

CT MAXILLOFACIAL FINDINGS

Osseous:

--Complex facial fracture types: No LeFort, zygomaticomaxillary
complex or nasoorbitoethmoidal fracture.

--Simple fracture types: Medial displacement of right nasal bone
fracture

--Mandible: No fracture or dislocation.

Orbits: The globes are intact. Normal appearance of the intra- and
extraconal fat. Symmetric extraocular muscles and optic nerves.

Sinuses: No fluid levels or advanced mucosal thickening.

Soft tissues: Paranasal and right facial soft tissue swelling.

CT CERVICAL SPINE FINDINGS

Alignment: No static subluxation. Facets are aligned. Occipital
condyles and the lateral masses of C1-C2 are aligned.

Skull base and vertebrae: No acute fracture.

Soft tissues and spinal canal: No prevertebral fluid or swelling. No
visible canal hematoma.

Disc levels: Multilevel degenerative disc disease. No bony spinal
canal stenosis. Bilateral moderate stenosis of the C7 neural
foramina.

Upper chest: No pneumothorax, pulmonary nodule or pleural effusion.

Other: Normal visualized paraspinal cervical soft tissues.
IMPRESSION: 1. No acute intracranial abnormality.
2. Large right frontal scalp hematoma without skull fracture.
3. Medially displaced right nasal bone fracture. No other facial
fracture.
4. No acute fracture or static subluxation of the cervical spine.
